# Patient Record
Sex: Male | Born: 1995
Health system: Southern US, Community
[De-identification: ages and names within clinical notes are randomized; demographics above are authoritative.]

## PROBLEM LIST (undated history)

## (undated) DIAGNOSIS — E739 Lactose intolerance, unspecified: Secondary | ICD-10-CM

## (undated) HISTORY — PX: EXTERNAL EAR SURGERY: SHX627

## (undated) HISTORY — PX: HAND SURGERY: SHX662

## (undated) HISTORY — PX: EYE SURGERY: SHX253

---
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
## 2018-02-18 ENCOUNTER — Encounter: Payer: Self-pay | Admitting: Emergency Medicine

## 2018-02-18 ENCOUNTER — Other Ambulatory Visit: Payer: Self-pay

## 2018-02-18 ENCOUNTER — Ambulatory Visit (INDEPENDENT_AMBULATORY_CARE_PROVIDER_SITE_OTHER): Payer: 59 | Admitting: Emergency Medicine

## 2018-02-18 VITALS — BP 132/79 | HR 78 | Temp 98.3°F | Resp 20 | Ht 69.21 in | Wt 235.4 lb

## 2018-02-18 DIAGNOSIS — Z0000 Encounter for general adult medical examination without abnormal findings: Secondary | ICD-10-CM | POA: Diagnosis not present

## 2018-02-18 NOTE — Progress Notes (Signed)
Alan Burns 22 y.o.   Chief Complaint  Patient presents with  . Annual Exam    with paperwork    HISTORY OF PRESENT ILLNESS: This is a 22 y.o. male Here for annual exam; no complaints and no medical concerns.    HPI   Prior to Admission medications   Not on File    No Known Allergies  Patient Active Problem List   Diagnosis Date Noted  . Anemia, iron deficiency 07/25/2015    Past Medical History:  Diagnosis Date  . Lactose intolerance     Past Surgical History:  Procedure Laterality Date  . EXTERNAL EAR SURGERY    . EYE SURGERY    . HAND SURGERY Right     Social History   Socioeconomic History  . Marital status: Single    Spouse name: Not on file  . Number of children: 0  . Years of education: Not on file  . Highest education level: Not on file  Occupational History  . Not on file  Social Needs  . Financial resource strain: Not on file  . Food insecurity:    Worry: Not on file    Inability: Not on file  . Transportation needs:    Medical: Not on file    Non-medical: Not on file  Tobacco Use  . Smoking status: Never Smoker  . Smokeless tobacco: Never Used  Substance and Sexual Activity  . Alcohol use: Not Currently    Alcohol/week: 0.0 oz  . Drug use: Never  . Sexual activity: Never  Lifestyle  . Physical activity:    Days per week: Not on file    Minutes per session: Not on file  . Stress: Not on file  Relationships  . Social connections:    Talks on phone: Not on file    Gets together: Not on file    Attends religious service: Not on file    Active member of club or organization: Not on file    Attends meetings of clubs or organizations: Not on file    Relationship status: Not on file  . Intimate partner violence:    Fear of current or ex partner: Not on file    Emotionally abused: Not on file    Physically abused: Not on file    Forced sexual activity: Not on file  Other Topics Concern  . Not on file  Social History  Narrative  . Not on file    History reviewed. No pertinent family history.   Review of Systems  Constitutional: Negative.  Negative for chills and fever.  HENT: Negative.  Negative for congestion, hearing loss and sore throat.   Eyes: Negative.  Negative for blurred vision and double vision.  Respiratory: Negative.  Negative for cough, hemoptysis and shortness of breath.   Cardiovascular: Negative.  Negative for chest pain and palpitations.  Gastrointestinal: Negative.  Negative for abdominal pain, blood in stool, diarrhea, melena, nausea and vomiting.  Genitourinary: Negative.  Negative for dysuria and hematuria.  Musculoskeletal: Negative.  Negative for back pain, myalgias and neck pain.  Skin: Negative.  Negative for rash.  Neurological: Negative.  Negative for dizziness and headaches.  Endo/Heme/Allergies: Negative.   All other systems reviewed and are negative.  Vitals:   02/18/18 1440  BP: 132/79  Pulse: 78  Resp: 20  Temp: 98.3 F (36.8 C)  SpO2: 98%     Physical Exam  Constitutional: He is oriented to person, place, and time. He appears well-developed  and well-nourished.  HENT:  Head: Normocephalic and atraumatic.  Right Ear: External ear normal.  Left Ear: External ear normal.  Nose: Nose normal.  Mouth/Throat: Oropharynx is clear and moist.  Eyes: Pupils are equal, round, and reactive to light. Conjunctivae and EOM are normal.  Neck: Normal range of motion. Neck supple. No JVD present.  Cardiovascular: Normal rate, regular rhythm and normal heart sounds.  Pulmonary/Chest: Effort normal and breath sounds normal.  Abdominal: Soft. Bowel sounds are normal. He exhibits no distension. There is no tenderness.  Musculoskeletal: Normal range of motion.  Lymphadenopathy:    He has no cervical adenopathy.  Neurological: He is alert and oriented to person, place, and time. No sensory deficit. He exhibits normal muscle tone.  Skin: Skin is warm and dry. Capillary refill  takes less than 2 seconds. No rash noted.  Psychiatric: He has a normal mood and affect. His behavior is normal.  Vitals reviewed.    ASSESSMENT & PLAN: [name] was seen today for annual exam.  Diagnoses and all orders for this visit:  Routine general medical examination at a health care facility    Patient Instructions       IF you received an x-ray today, you will receive an invoice from [facility]. Please contact [facility] at [phone] with questions or concerns regarding your invoice.   IF you received labwork today, you will receive an invoice from [place] [facility]. Please contact [facility] at [phone] with questions or concerns regarding your invoice.   Our billing staff will not be able to assist you with questions regarding bills from these companies.  You will be contacted with the lab results as soon as they are available. The fastest way to get your results is to activate your My Chart account. Instructions are located on the last page of this paperwork. If you have not heard from [place] regarding the results in 2 weeks, please contact this office.      Health Maintenance, Male A healthy lifestyle and preventive care is important for your health and wellness. Ask your health care provider about what schedule of regular examinations is right for you. What should I know about weight and diet? Eat a Healthy Diet  Eat plenty of vegetables, fruits, whole grains, low-fat dairy products, and lean protein.  Do not eat a lot of foods high in solid fats, added sugars, or salt.  Maintain a Healthy Weight Regular exercise can help you achieve or maintain a healthy weight. You should:  Do at least 150 minutes of exercise each week. The exercise should increase your heart rate and make you sweat (moderate-intensity exercise).  Do strength-training exercises at least twice a week.  Watch Your Levels of Cholesterol and Blood Lipids  Have your blood tested  for lipids and cholesterol every 5 years starting at 20 years of age. If you are at high risk for heart disease, you should start having your blood tested when you are 20 years old. You may need to have your cholesterol levels checked more often if: ? Your lipid or cholesterol levels are high. ? You are older than 20 years of age. ? You are at high risk for heart disease.  What should I know about cancer screening? Many types of cancers can be detected early and may often be prevented. Lung Cancer  You should be screened every year for lung cancer if: ? You are a current smoker who has smoked for at least 30 years. ? You are a former  smoker who has quit within the past 15 years.  Talk to your health care provider about your screening options, when you should start screening, and how often you should be screened.  Colorectal Cancer  Routine colorectal cancer screening usually begins at 20 years of age and should be repeated every 5-10 years until you are 20 years old. You may need to be screened more often if early forms of precancerous polyps or small growths are found. Your health care provider may recommend screening at an earlier age if you have risk factors for colon cancer.  Your health care provider may recommend using home test kits to check for hidden blood in the stool.  A small camera at the end of a tube can be used to examine your colon (sigmoidoscopy or colonoscopy). This checks for the earliest forms of colorectal cancer.  Prostate and Testicular Cancer  Depending on your age and overall health, your health care provider may do certain tests to screen for prostate and testicular cancer.  Talk to your health care provider about any symptoms or concerns you have about testicular or prostate cancer.  Skin Cancer  Check your skin from head to toe regularly.  Tell your health care provider about any new moles or changes in moles, especially if: ? There is a change in a  mole's size, shape, or color. ? You have a mole that is larger than a pencil eraser.  Always use sunscreen. Apply sunscreen liberally and repeat throughout the day.  Protect yourself by wearing long sleeves, pants, a wide-brimmed hat, and sunglasses when outside.  What should I know about heart disease, diabetes, and high blood pressure?  If you are 21-38 years of age, have your blood pressure checked every 3-5 years. If you are 58 years of age or older, have your blood pressure checked every year. You should have your blood pressure measured twice-once when you are at a hospital or clinic, and once when you are not at a hospital or clinic. Record the average of the two measurements. To check your blood pressure when you are not at a hospital or clinic, you can use: ? An automated blood pressure machine at a pharmacy. ? A home blood pressure monitor.  Talk to your health care provider about your target blood pressure.  If you are between 20-77 years old, ask your health care provider if you should take aspirin to prevent heart disease.  Have regular diabetes screenings by checking your fasting blood sugar level. ? If you are at a normal weight and have a low risk for diabetes, have this test once every three years after the age of 22. ? If you are overweight and have a high risk for diabetes, consider being tested at a younger age or more often.  A one-time screening for abdominal aortic aneurysm (AAA) by ultrasound is recommended for men aged 65-75 years who are current or former smokers. What should I know about preventing infection? Hepatitis B If you have a higher risk for hepatitis B, you should be screened for this virus. Talk with your health care provider to find out if you are at risk for hepatitis B infection. Hepatitis C Blood testing is recommended for:  Everyone born from [birth year] through 1963.  Anyone with known risk factors for hepatitis C.  Sexually Transmitted Diseases  (STDs)  You should be screened each year for STDs including gonorrhea and chlamydia if: ? You are sexually active and are younger than 20 years  of age. ? You are older than 20 years of age and your health care provider tells you that you are at risk for this type of infection. ? Your sexual activity has changed since you were last screened and you are at an increased risk for chlamydia or gonorrhea. Ask your health care provider if you are at risk.  Talk with your health care provider about whether you are at high risk of being infected with HIV. Your health care provider may recommend a prescription medicine to help prevent HIV infection.  What else can I do?  Schedule regular health, dental, and eye exams.  Stay current with your vaccines (immunizations).  Do not use any tobacco products, such as cigarettes, chewing tobacco, and e-cigarettes. If you need help quitting, ask your health care provider.  Limit alcohol intake to no more than 2 drinks per day. One drink equals 12 ounces of beer, 5 ounces of wine, or 1 ounces of hard liquor.  Do not use street drugs.  Do not share needles.  Ask your health care provider for help if you need support or information about quitting drugs.  Tell your health care provider if you often feel depressed.  Tell your health care provider if you have ever been abused or do not feel safe at home. This information is not intended to replace advice given to you by your health care provider. Make sure you discuss any questions you have with your health care provider. Document Released: 03/12/2008 Document Revised: 05/13/2016 Document Reviewed: 06/18/2015 Elsevier Interactive Patient Education  2018 [employer]      Edwina Barth, MD [facility]

## 2018-02-18 NOTE — Patient Instructions (Addendum)
   IF you received an x-ray today, you will receive an invoice from [facility]. Please contact [facility] at [phone] with questions or concerns regarding your invoice.   IF you received labwork today, you will receive an invoice from [facility]. Please contact [facility] at [phone] with questions or concerns regarding your invoice.   Our billing staff will not be able to assist you with questions regarding bills from these companies.  You will be contacted with the lab results as soon as they are available. The fastest way to get your results is to activate your My Chart account. Instructions are located on the last page of this paperwork. If you have not heard from us regarding the results in 2 weeks, please contact this office.      Health Maintenance, Male A healthy lifestyle and preventive care is important for your health and wellness. Ask your health care provider about what schedule of regular examinations is right for you. What should I know about weight and diet? Eat a Healthy Diet  Eat plenty of vegetables, fruits, whole grains, low-fat dairy products, and lean protein.  Do not eat a lot of foods high in solid fats, added sugars, or salt.  Maintain a Healthy Weight Regular exercise can help you achieve or maintain a healthy weight. You should:  Do at least 150 minutes of exercise each week. The exercise should increase your heart rate and make you sweat (moderate-intensity exercise).  Do strength-training exercises at least twice a week.  Watch Your Levels of Cholesterol and Blood Lipids  Have your blood tested for lipids and cholesterol every 5 years starting at 20 years of age. If you are at high risk for heart disease, you should start having your blood tested when you are 20 years old. You may need to have your cholesterol levels checked more often if: ? Your lipid or cholesterol levels are high. ? You are older than 20 years of age. ? You  are at high risk for heart disease.  What should I know about cancer screening? Many types of cancers can be detected early and may often be prevented. Lung Cancer  You should be screened every year for lung cancer if: ? You are a current smoker who has smoked for at least 30 years. ? You are a former smoker who has quit within the past 15 years.  Talk to your health care provider about your screening options, when you should start screening, and how often you should be screened.  Colorectal Cancer  Routine colorectal cancer screening usually begins at 20 years of age and should be repeated every 5-10 years until you are 20 years old. You may need to be screened more often if early forms of precancerous polyps or small growths are found. Your health care provider may recommend screening at an earlier age if you have risk factors for colon cancer.  Your health care provider may recommend using home test kits to check for hidden blood in the stool.  A small camera at the end of a tube can be used to examine your colon (sigmoidoscopy or colonoscopy). This checks for the earliest forms of colorectal cancer.  Prostate and Testicular Cancer  Depending on your age and overall health, your health care provider may do certain tests to screen for prostate and testicular cancer.  Talk to your health care provider about any symptoms or concerns you have about testicular or prostate cancer.  Skin Cancer  Check your skin   from head to toe regularly.  Tell your health care provider about any new moles or changes in moles, especially if: ? There is a change in a mole's size, shape, or color. ? You have a mole that is larger than a pencil eraser.  Always use sunscreen. Apply sunscreen liberally and repeat throughout the day.  Protect yourself by wearing long sleeves, pants, a wide-brimmed hat, and sunglasses when outside.  What should I know about heart disease, diabetes, and high blood  pressure?  If you are 18-39 years of age, have your blood pressure checked every 3-5 years. If you are 40 years of age or older, have your blood pressure checked every year. You should have your blood pressure measured twice-once when you are at a hospital or clinic, and once when you are not at a hospital or clinic. Record the average of the two measurements. To check your blood pressure when you are not at a hospital or clinic, you can use: ? An automated blood pressure machine at a pharmacy. ? A home blood pressure monitor.  Talk to your health care provider about your target blood pressure.  If you are between 45-79 years old, ask your health care provider if you should take aspirin to prevent heart disease.  Have regular diabetes screenings by checking your fasting blood sugar level. ? If you are at a normal weight and have a low risk for diabetes, have this test once every three years after the age of 45. ? If you are overweight and have a high risk for diabetes, consider being tested at a younger age or more often.  A one-time screening for abdominal aortic aneurysm (AAA) by ultrasound is recommended for men aged 65-75 years who are current or former smokers. What should I know about preventing infection? Hepatitis B If you have a higher risk for hepatitis B, you should be screened for this virus. Talk with your health care provider to find out if you are at risk for hepatitis B infection. Hepatitis C Blood testing is recommended for:  Everyone born from 1945 through 1965.  Anyone with known risk factors for hepatitis C.  Sexually Transmitted Diseases (STDs)  You should be screened each year for STDs including gonorrhea and chlamydia if: ? You are sexually active and are younger than 20 years of age. ? You are older than 20 years of age and your health care provider tells you that you are at risk for this type of infection. ? Your sexual activity has changed since you were last  screened and you are at an increased risk for chlamydia or gonorrhea. Ask your health care provider if you are at risk.  Talk with your health care provider about whether you are at high risk of being infected with HIV. Your health care provider may recommend a prescription medicine to help prevent HIV infection.  What else can I do?  Schedule regular health, dental, and eye exams.  Stay current with your vaccines (immunizations).  Do not use any tobacco products, such as cigarettes, chewing tobacco, and e-cigarettes. If you need help quitting, ask your health care provider.  Limit alcohol intake to no more than 2 drinks per day. One drink equals 12 ounces of beer, 5 ounces of wine, or 1 ounces of hard liquor.  Do not use street drugs.  Do not share needles.  Ask your health care provider for help if you need support or information about quitting drugs.  Tell your health care   provider if you often feel depressed.  Tell your health care provider if you have ever been abused or do not feel safe at home. This information is not intended to replace advice given to you by your health care provider. Make sure you discuss any questions you have with your health care provider. Document Released: 03/12/2008 Document Revised: 05/13/2016 Document Reviewed: 06/18/2015 [employer] Interactive Patient Education  2018 [employer]  

## 2018-04-27 NOTE — Addendum Note (Signed)
Addended by: Evie LacksSAGARDIA, Mistey Hoffert J on: 04/27/2018 02:54 PM   Modules accepted: Level of Service

## 2018-05-03 DIAGNOSIS — Z809 Family history of malignant neoplasm, unspecified: Secondary | ICD-10-CM | POA: Diagnosis not present

## 2018-05-03 DIAGNOSIS — Z131 Encounter for screening for diabetes mellitus: Secondary | ICD-10-CM | POA: Diagnosis not present

## 2018-05-03 DIAGNOSIS — R946 Abnormal results of thyroid function studies: Secondary | ICD-10-CM | POA: Diagnosis not present

## 2018-05-03 DIAGNOSIS — D649 Anemia, unspecified: Secondary | ICD-10-CM | POA: Diagnosis not present

## 2018-05-03 DIAGNOSIS — Z833 Family history of diabetes mellitus: Secondary | ICD-10-CM | POA: Diagnosis not present

## 2018-09-30 DIAGNOSIS — Z0100 Encounter for examination of eyes and vision without abnormal findings: Secondary | ICD-10-CM | POA: Diagnosis not present
